# Patient Record
Sex: Female | Born: 2000 | Race: White | Hispanic: No | Marital: Single | Smoking: Never smoker
Health system: Southern US, Community
[De-identification: ages and names within clinical notes are randomized; demographics above are authoritative.]

## PROBLEM LIST (undated history)

## (undated) DIAGNOSIS — N946 Dysmenorrhea, unspecified: Secondary | ICD-10-CM

## (undated) HISTORY — DX: Dysmenorrhea, unspecified: N94.6

## (undated) HISTORY — PX: INTRAUTERINE DEVICE (IUD) INSERTION: SHX5877

---
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
## 2018-10-04 ENCOUNTER — Other Ambulatory Visit: Payer: Self-pay

## 2018-10-04 NOTE — Progress Notes (Signed)
18 y.o. Single Caucasian female G0P0000 here with complaint of vaginal symptoms of itching, burning, and increase discharge. Describes discharge as thicker, no odor, white in color. Partner no symptoms.  Onset of symptoms 6  days ago. Denies new personal products. Has been swimming and staying in swimsuit longer times. Sexually active, no partner change. No STD concerns or testing needed.  Urinary symptoms at onset of frequency, but has stopped. Contraception is IUD [place], working well.  Review of Systems  Constitutional: Negative.   HENT: Negative.   Eyes: Negative.   Respiratory: Negative.   Cardiovascular: Negative.   Gastrointestinal: Negative.   Genitourinary: Negative.   Musculoskeletal: Negative.   Skin:       Vaginal itching, irritation & discharge  Neurological: Negative.   Endo/Heme/Allergies: Negative.   Psychiatric/Behavioral: Negative.     O:Healthy female WDWN Affect: normal, orientation x 3  Exam: Healthy female WDWN Abdomen:Soft, non tender, negative suprapubic Lymph node: no enlargement or tenderness Pelvic exam: External genital: normal female BUS: negative Bladder, urethral meatus non tender Vagina: white thick slight odorous discharge noted.     Affirm taken Cervix: normal, non tender, no CMT, IUD string noted in cervix Uterus: normal, non tender Adnexa:normal, non tender, no masses or fullness noted    A:Normal pelvic exam Contraception Mirena IUD R/O vaginal infection   P:Discussed findings of normal pelvic exam with increase white slightly odorous discharge and etiology. Discussed Aveeno or baking soda sitz bath for comfort. Avoid moist clothes  for extended period of time. If working out in gym clothes or swim suits for long periods of time change underwear or bottoms of swimsuit if possible. STD prevention with condom use recommended. Questions addressed. Lab: Affirm  Rv prn

## 2018-10-05 ENCOUNTER — Other Ambulatory Visit: Payer: Self-pay

## 2018-10-05 ENCOUNTER — Ambulatory Visit: Payer: 59 | Admitting: Certified Nurse Midwife

## 2018-10-05 ENCOUNTER — Encounter: Payer: Self-pay | Admitting: Certified Nurse Midwife

## 2018-10-05 VITALS — BP 100/64 | HR 64 | Temp 97.2°F | Resp 16 | Wt 119.0 lb

## 2018-10-05 DIAGNOSIS — N898 Other specified noninflammatory disorders of vagina: Secondary | ICD-10-CM

## 2018-10-05 DIAGNOSIS — Z01419 Encounter for gynecological examination (general) (routine) without abnormal findings: Secondary | ICD-10-CM

## 2018-10-06 LAB — VAGINITIS/VAGINOSIS, DNA PROBE
Candida Species: NEGATIVE
Gardnerella vaginalis: NEGATIVE
Trichomonas vaginosis: NEGATIVE

## 2019-03-01 NOTE — Progress Notes (Signed)
18 y.o. G0P0000 Single  Caucasian Fe here for annual exam. Contraception Mirena IUD working well.  No partner change. Desires STD screening. Partner change.  Attending college and now out for long break. All classes online now. No other health issues.  No LMP recorded. (Menstrual status: IUD).          Sexually active: Yes.    The current method of family planning is condoms all the time & IUD.    Exercising: Yes.    occasional Smoker:  no  Review of Systems  Constitutional: Negative.   HENT: Negative.   Eyes: Negative.   Respiratory: Negative.   Cardiovascular: Negative.   Gastrointestinal: Negative.   Genitourinary: Negative.   Musculoskeletal: Negative.   Skin: Negative.   Neurological: Negative.   Endo/Heme/Allergies: Negative.   Psychiatric/Behavioral: Negative.     Health Maintenance: Pap:  none History of Abnormal Pap: no MMG:  none Self Breast exams: yes Colonoscopy:  none BMD:   none TDaP:  2012 Shingles: no Pneumonia: 2001 Hep C and HIV: not done Labs: yes   reports that she has never smoked. She has never used smokeless tobacco. She reports that she does not drink alcohol or use drugs.  Past Medical History:  Diagnosis Date  . Dysmenorrhea     Past Surgical History:  Procedure Laterality Date  . INTRAUTERINE DEVICE (IUD) INSERTION     06-15-17 mirena inserted    Current Outpatient Medications  Medication Sig Dispense Refill  . doxycycline (VIBRAMYCIN) 100 MG capsule     . levonorgestrel (MIRENA) 20 MCG/24HR IUD 1 each by Intrauterine route once.    . ondansetron (ZOFRAN-ODT) 4 MG disintegrating tablet Take 4 mg by mouth every 8 (eight) hours as needed. for nausea  3   No current facility-administered medications for this visit.     Family History  Problem Relation Age of Onset  . Cancer Maternal Grandfather   . Diabetes Maternal Grandfather     ROS:  Pertinent items are noted in HPI.  Otherwise, a comprehensive ROS was negative.  Exam:    There were no vitals taken for this visit.   Ht Readings from Last 3 Encounters:  12/18/17 5' 5.25" (1.657 m) (65 %, Z= 0.38)*  11/25/17 5\' 6"  (1.676 m) (75 %, Z= 0.68)*  01/08/17 5' 5.25" (1.657 m) (66 %, Z= 0.40)*   * Growth percentiles are based on CDC (Girls, 2-20 Years) data.    General appearance: alert, cooperative and appears stated age Head: Normocephalic, without obvious abnormality, atraumatic Neck: no adenopathy, supple, symmetrical, trachea midline and thyroid normal to inspection and palpation Lungs: clear to auscultation bilaterally Breasts: normal appearance, no masses or tenderness, No nipple retraction or dimpling, No nipple discharge or bleeding, No axillary or supraclavicular adenopathy Heart: regular rate and rhythm Abdomen: soft, non-tender; no masses,  no organomegaly Extremities: extremities normal, atraumatic, no cyanosis or edema Skin: Skin color, texture, turgor normal. No rashes or lesions Lymph nodes: Cervical, supraclavicular, and axillary nodes normal. No abnormal inguinal nodes palpated Neurologic: Grossly normal   Pelvic: External genitalia:  no lesions              Urethra:  normal appearing urethra with no masses, tenderness or lesions              Bartholin's and Skene's: normal                 Vagina: normal appearing vagina with normal color and discharge, no lesions  Cervix: no cervical motion tenderness, no lesions, nulliparous appearance and IUD string noted in cervix              Pap taken: No. Bimanual Exam:  Uterus:  normal size, contour, position, consistency, mobility, non-tender and anteverted              Adnexa: normal adnexa and no mass, fullness, tenderness               Rectovaginal: Confirms               Anus:  normal appearance, no lesions  Chaperone present: yes [place]  A:  Well Woman with normal exam  Contraception Mirena IUD  STD screening  P:   Reviewed health and wellness pertinent to exam  Warning  signs with IUD reviewed and importance of notifying if occurs. Aware of bleeding profile expectations  Lab: HIV, RPR, Hep C, Affirm, GC,Chlamydia  Pap smear: age 18   counseled on breast self exam, STD prevention, HIV risk factors and prevention, feminine hygiene, adequate intake of calcium and vitamin D, diet and exercise  return annually or prn  An After Visit Summary was printed and given to the patient.

## 2019-03-02 ENCOUNTER — Other Ambulatory Visit: Payer: Self-pay

## 2019-03-02 ENCOUNTER — Ambulatory Visit: Payer: 59 | Admitting: Certified Nurse Midwife

## 2019-03-02 ENCOUNTER — Encounter: Payer: Self-pay | Admitting: Certified Nurse Midwife

## 2019-03-02 VITALS — BP 118/80 | HR 72 | Temp 97.0°F | Resp 16 | Ht 65.0 in | Wt 115.0 lb

## 2019-03-02 DIAGNOSIS — Z113 Encounter for screening for infections with a predominantly sexual mode of transmission: Secondary | ICD-10-CM

## 2019-03-02 DIAGNOSIS — Z01419 Encounter for gynecological examination (general) (routine) without abnormal findings: Secondary | ICD-10-CM | POA: Diagnosis not present

## 2019-03-02 NOTE — Patient Instructions (Signed)
General topics  Next pap or exam is  due in 1 year Take a Women's multivitamin Take 1200 mg. of calcium daily - prefer dietary If any concerns in interim to call back  Breast Self-Awareness Practicing breast self-awareness may pick up problems early, prevent significant medical complications, and possibly save your life. By practicing breast self-awareness, you can become familiar with how your breasts look and feel and if your breasts are changing. This allows you to notice changes early. It can also offer you some reassurance that your breast health is good. One way to learn what is normal for your breasts and whether your breasts are changing is to do a breast self-exam. If you find a lump or something that was not present in the past, it is best to contact your caregiver right away. Other findings that should be evaluated by your caregiver include nipple discharge, especially if it is bloody; skin changes or reddening; areas where the skin seems to be pulled in (retracted); or new lumps and bumps. Breast pain is seldom associated with cancer (malignancy), but should also be evaluated by a caregiver. BREAST SELF-EXAM The best time to examine your breasts is 5 7 days after your menstrual period is over.  ExitCare Patient Information 2013 [place].   Exercise to Stay Healthy Exercise helps you become and stay healthy. EXERCISE IDEAS AND TIPS Choose exercises that:  You enjoy.  Fit into your day. You do not need to exercise really hard to be healthy. You can do exercises at a slow or medium level and stay healthy. You can:  Stretch before and after working out.  Try yoga, Pilates, or tai chi.  Lift weights.  Walk fast, swim, jog, run, climb stairs, bicycle, dance, or rollerskate.  Take aerobic classes. Exercises that burn about 150 calories:  Running 1  miles in 15 minutes.  Playing volleyball for 45 to 60 minutes.  Washing and waxing a car for 45 to 60 minutes.   Playing touch football for 45 minutes.  Walking 1  miles in 35 minutes.  Pushing a stroller 1  miles in 30 minutes.  Playing basketball for 30 minutes.  Raking leaves for 30 minutes.  Bicycling 5 miles in 30 minutes.  Walking 2 miles in 30 minutes.  Dancing for 30 minutes.  Shoveling snow for 15 minutes.  Swimming laps for 20 minutes.  Walking up stairs for 15 minutes.  Bicycling 4 miles in 15 minutes.  Gardening for 30 to 45 minutes.  Jumping rope for 15 minutes.  Washing windows or floors for 45 to 60 minutes. Document Released: 04/12/2010 Document Revised: 06/02/2011 Document Reviewed: 04/12/2010 [facility] Patient Information 2013 [place].   Other topics ( that may be useful information):    Sexually Transmitted Disease Sexually transmitted disease (STD) refers to any infection that is passed from person to person during sexual activity. This may happen by way of saliva, semen, blood, vaginal mucus, or urine. Common STDs include:  Gonorrhea.  Chlamydia.  Syphilis.  HIV/AIDS.  Genital herpes.  Hepatitis B and C.  Trichomonas.  Human papillomavirus (HPV).  Pubic lice. CAUSES  An STD may be spread by bacteria, virus, or parasite. A person can get an STD by:  Sexual intercourse with an infected person.  Sharing sex toys with an infected person.  Sharing needles with an infected person.  Having intimate contact with the genitals, mouth, or rectal areas of an infected person. SYMPTOMS  Some people may not have any symptoms, but  they can still pass the infection to others. Different STDs have different symptoms. Symptoms include:  Painful or bloody urination.  Pain in the pelvis, abdomen, vagina, anus, throat, or eyes.  Skin rash, itching, irritation, growths, or sores (lesions). These usually occur in the genital or anal area.  Abnormal vaginal discharge.  Penile discharge in men.  Soft, flesh-colored skin growths in the genital  or anal area.  Fever.  Pain or bleeding during sexual intercourse.  Swollen glands in the groin area.  Yellow skin and eyes (jaundice). This is seen with hepatitis. DIAGNOSIS  To make a diagnosis, your caregiver may:  Take a medical history.  Perform a physical exam.  Take a specimen (culture) to be examined.  Examine a sample of discharge under a microscope.  Perform blood test TREATMENT   Chlamydia, gonorrhea, trichomonas, and syphilis can be cured with antibiotic medicine.  Genital herpes, hepatitis, and HIV can be treated, but not cured, with prescribed medicines. The medicines will lessen the symptoms.  Genital warts from HPV can be treated with medicine or by freezing, burning (electrocautery), or surgery. Warts may come back.  HPV is a virus and cannot be cured with medicine or surgery.However, abnormal areas may be followed very closely by your caregiver and may be removed from the cervix, vagina, or vulva through office procedures or surgery. If your diagnosis is confirmed, your recent sexual partners need treatment. This is true even if they are symptom-free or have a negative culture or evaluation. They should not have sex until their caregiver says it is okay. HOME CARE INSTRUCTIONS  All sexual partners should be informed, tested, and treated for all STDs.  Take your antibiotics as directed. Finish them even if you start to feel better.  Only take over-the-counter or prescription medicines for pain, discomfort, or fever as directed by your caregiver.  Rest.  Eat a balanced diet and drink enough fluids to keep your urine clear or pale yellow.  Do not have sex until treatment is completed and you have followed up with your caregiver. STDs should be checked after treatment.  Keep all follow-up appointments, Pap tests, and blood tests as directed by your caregiver.  Only use latex condoms and water-soluble lubricants during sexual activity. Do not use petroleum  jelly or oils.  Avoid alcohol and illegal drugs.  Get vaccinated for HPV and hepatitis. If you have not received these vaccines in the past, talk to your caregiver about whether one or both might be right for you.  Avoid risky sex practices that can break the skin. The only way to avoid getting an STD is to avoid all sexual activity.Latex condoms and dental dams (for oral sex) will help lessen the risk of getting an STD, but will not completely eliminate the risk. SEEK MEDICAL CARE IF:   You have a fever.  You have any new or worsening symptoms. Document Released: 05/31/2002 Document Revised: 06/02/2011 Document Reviewed: 06/07/2010 [facility] Patient Information 2013 [place].    Domestic Abuse You are being battered or abused if someone close to you hits, pushes, or physically hurts you in any way. You also are being abused if you are forced into activities. You are being sexually abused if you are forced to have sexual contact of any kind. You are being emotionally abused if you are made to feel worthless or if you are constantly threatened. It is important to remember that help is available. No one has the right to abuse you. PREVENTION OF FURTHER  ABUSE  Learn the warning signs of danger. This varies with situations but may include: the use of alcohol, threats, isolation from friends and family, or forced sexual contact. Leave if you feel that violence is going to occur.  If you are attacked or beaten, report it to the police so the abuse is documented. You do not have to press charges. The police can protect you while you or the attackers are leaving. Get the officer's name and badge number and a copy of the report.  Find someone you can trust and tell them what is happening to you: your caregiver, a nurse, clergy member, close friend or family member. Feeling ashamed is natural, but remember that you have done nothing wrong. No one deserves abuse. Document Released: 03/07/2000  Document Revised: 06/02/2011 Document Reviewed: 05/16/2010 [facility] Patient Information 2013 [place].    How Much is Too Much Alcohol? Drinking too much alcohol can cause injury, accidents, and health problems. These types of problems can include:   Car crashes.  Falls.  Family fighting (domestic violence).  Drowning.  Fights.  Injuries.  Burns.  Damage to certain organs.  Having a baby with birth defects. ONE DRINK CAN BE TOO MUCH WHEN YOU ARE:  Working.  Pregnant or breastfeeding.  Taking medicines. Ask your doctor.  Driving or planning to drive. If you or someone you know has a drinking problem, get help from a doctor.  Document Released: 01/04/2009 Document Revised: 06/02/2011 Document Reviewed: 01/04/2009 [facility] Patient Information 2013 [place].   Smoking Hazards Smoking cigarettes is extremely bad for your health. Tobacco smoke has over 200 known poisons in it. There are over 60 chemicals in tobacco smoke that cause cancer. Some of the chemicals found in cigarette smoke include:   Cyanide.  Benzene.  Formaldehyde.  Methanol (wood alcohol).  Acetylene (fuel used in welding torches).  Ammonia. Cigarette smoke also contains the poisonous gases nitrogen oxide and carbon monoxide.  Cigarette smokers have an increased risk of many serious medical problems and Smoking causes approximately:  90% of all lung cancer deaths in men.  80% of all lung cancer deaths in women.  90% of deaths from chronic obstructive lung disease. Compared with nonsmokers, smoking increases the risk of:  Coronary heart disease by 2 to 4 times.  Stroke by 2 to 4 times.  Men developing lung cancer by 23 times.  Women developing lung cancer by 13 times.  Dying from chronic obstructive lung diseases by 12 times.  . Smoking is the most preventable cause of death and disease in our society.  WHY IS SMOKING ADDICTIVE?  Nicotine is the chemical agent in  tobacco that is capable of causing addiction or dependence.  When you smoke and inhale, nicotine is absorbed rapidly into the bloodstream through your lungs. Nicotine absorbed through the lungs is capable of creating a powerful addiction. Both inhaled and non-inhaled nicotine may be addictive.  Addiction studies of cigarettes and spit tobacco show that addiction to nicotine occurs mainly during the teen years, when young people begin using tobacco products. WHAT ARE THE BENEFITS OF QUITTING?  There are many health benefits to quitting smoking.   Likelihood of developing cancer and heart disease decreases. Health improvements are seen almost immediately.  Blood pressure, pulse rate, and breathing patterns start returning to normal soon after quitting. QUITTING SMOKING   American Lung Association - 1-800-LUNGUSA  American Cancer Society - 1-800-ACS-2345 Document Released: 04/17/2004 Document Revised: 06/02/2011 Document Reviewed: 12/20/2008 [facility] Patient Information 2013 [place].   Stress Management Stress is a state of physical or mental tension that often results from changes in your life or normal routine. Some common causes of stress are:  Death of a loved one.  Injuries or severe illnesses.  Getting fired or changing jobs.  Moving into a new home. Other causes may be:  Sexual problems.  Business or financial losses.  Taking on a large debt.  Regular conflict with someone at home or at work.  Constant tiredness from lack of sleep. It is not just bad things that are stressful. It may be stressful to:  Win the lottery.  Get married.  Buy a new car. The amount of stress that can be easily tolerated varies from person to person. Changes generally cause stress, regardless of the types of change. Too much stress can affect your health. It may lead to physical or emotional problems. Too little stress (boredom) may also become stressful. SUGGESTIONS TO REDUCE  STRESS:  Talk things over with your family and friends. It often is helpful to share your concerns and worries. If you feel your problem is serious, you may want to get help from a professional counselor.  Consider your problems one at a time instead of lumping them all together. Trying to take care of everything at once may seem impossible. List all the things you need to do and then start with the most important one. Set a goal to accomplish 2 or 3 things each day. If you expect to do too many in a single day you will naturally fail, causing you to feel even more stressed.  Do not use alcohol or drugs to relieve stress. Although you may feel better for a short time, they do not remove the problems that caused the stress. They can also be habit forming.  Exercise regularly - at least 3 times per week. Physical exercise can help to relieve that "uptight" feeling and will relax you.  The shortest distance between despair and hope is often a good night's sleep.  Go to bed and get up on time allowing yourself time for appointments without being rushed.  Take a short "time-out" period from any stressful situation that occurs during the day. Close your eyes and take some deep breaths. Starting with the muscles in your face, tense them, hold it for a few seconds, then relax. Repeat this with the muscles in your neck, shoulders, hand, stomach, back and legs.  Take good care of yourself. Eat a balanced diet and get plenty of rest.  Schedule time for having fun. Take a break from your daily routine to relax. HOME CARE INSTRUCTIONS   Call if you feel overwhelmed by your problems and feel you can no longer manage them on your own.  Return immediately if you feel like hurting yourself or someone else. Document Released: 09/03/2000 Document Revised: 06/02/2011 Document Reviewed: 04/26/2007 [facility] Patient Information 2013 [place].

## 2019-03-03 LAB — VAGINITIS/VAGINOSIS, DNA PROBE
Candida Species: NEGATIVE
Gardnerella vaginalis: NEGATIVE
Trichomonas vaginosis: NEGATIVE

## 2019-03-03 LAB — RPR: RPR Ser Ql: NONREACTIVE

## 2019-03-03 LAB — HIV ANTIBODY (ROUTINE TESTING W REFLEX): HIV Screen 4th Generation wRfx: NONREACTIVE

## 2019-03-03 LAB — HEPATITIS C ANTIBODY: Hep C Virus Ab: <0.1 s/co ratio (ref 0.0–0.9)

## 2019-03-04 LAB — GC/CHLAMYDIA PROBE AMP
Chlamydia trachomatis, NAA: NEGATIVE
Neisseria Gonorrhoeae by PCR: NEGATIVE

## 2019-06-06 ENCOUNTER — Encounter: Payer: Self-pay | Admitting: Certified Nurse Midwife

## 2019-06-08 ENCOUNTER — Encounter: Payer: Self-pay | Admitting: Certified Nurse Midwife

## 2020-03-06 DIAGNOSIS — G8929 Other chronic pain: Secondary | ICD-10-CM | POA: Insufficient documentation

## 2020-03-06 DIAGNOSIS — F419 Anxiety disorder, unspecified: Secondary | ICD-10-CM | POA: Insufficient documentation

## 2020-09-11 NOTE — Progress Notes (Signed)
20 y.o. G0P0000 Single White or Caucasian Not Hispanic or Latino female here for annual exam.  She has a mirena IUD, placed in 3/19. No bleeding. Sexually active, no current partner. Using condoms. No dyspareunia.    No LMP recorded. (Menstrual status: IUD).          Sexually active: Yes.    The current method of family planning is IUD.    Exercising: Yes.     Walking  Smoker:  no  Health Maintenance: Pap:  03/06/20 normal with primary.  History of abnormal Pap:  NA MMG:  NA BMD:   NA Colonoscopy: NA TDaP:  10/18/10 Gardasil: Complete   reports that she has never smoked. She has never used smokeless tobacco. She reports that she does not drink alcohol and does not use drugs. 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 Wants to go to law school.   Past Medical History:  Diagnosis Date   Dysmenorrhea     Past Surgical History:  Procedure Laterality Date   INTRAUTERINE DEVICE (IUD) INSERTION     06-15-17 mirena inserted    Current Outpatient Medications  Medication Sig Dispense Refill   doxycycline (VIBRAMYCIN) 100 MG capsule      levonorgestrel (MIRENA) 20 MCG/24HR IUD 1 each by Intrauterine route once.     meloxicam (MOBIC) 7.5 MG tablet TAKE 1-2 TABLETS OF MELOXICAM 7.5 MG BY MOUTH DAILY AS NEEDED FOR PAIN.     No current facility-administered medications for this visit.    Family History  Problem Relation Age of Onset   Cancer Maternal Grandfather    Diabetes Maternal Grandfather     Review of Systems  All other systems reviewed and are negative.  Exam:   BP 118/72   Pulse (!) 109   Ht 5\' 5"  (1.651 m)   Wt 135 lb 6.4 oz (61.4 kg)   SpO2 100%   BMI 22.53 kg/m   Weight change: @WEIGHTCHANGE @ Height:   Height: 5\' 5"  (165.1 cm)  Ht Readings from Last 3 Encounters:  09/17/20 5\' 5"  (1.651 m)  03/02/19 5\' 5"  (1.651 m)  12/18/17 5' 5.25" (1.657 m) (65 %, Z= 0.38)*   * Growth percentiles are based on CDC (Girls, 2-20  Years) data.    General appearance: alert, cooperative and appears stated age Head: Normocephalic, without obvious abnormality, atraumatic Neck: no adenopathy, supple, symmetrical, trachea midline and thyroid normal to inspection and palpation Lungs: clear to auscultation bilaterally Cardiovascular: regular rate and rhythm Breasts: normal appearance, no masses or tenderness Abdomen: soft, non-tender; non distended,  no masses,  no organomegaly Extremities: extremities normal, atraumatic, no cyanosis or edema Skin: Skin color, texture, turgor normal. No rashes or lesions Lymph nodes: Cervical, supraclavicular, and axillary nodes normal. No abnormal inguinal nodes palpated Neurologic: Grossly normal   Pelvic: External genitalia:  no lesions              Urethra:  normal appearing urethra with no masses, tenderness or lesions              Bartholins and Skenes: normal                 Vagina: normal appearing vagina with normal color and discharge, no lesions              Cervix: no lesions and IUD string 3 cm               Bimanual Exam:  Uterus:  normal size, contour, position, consistency, mobility, non-tender and anteverted              Adnexa: no mass, fullness, tenderness               Rectovaginal: Confirms               Anus:  normal sphincter tone, no lesions  Elane Fritz, CMA chaperoned for the exam.  1. Well woman exam Discussed breast self exam Discussed calcium and vit D intake Labs with primary Pap UTD  2. Screening examination for STD (sexually transmitted disease) - HIV Antibody (routine testing w rflx) - RPR - SURESWAB CT/NG/T. vaginalis  3. IUD check up Doing well  4. Immunization due - Tdap vaccine greater than or equal to 7yo IM

## 2020-09-17 ENCOUNTER — Other Ambulatory Visit: Payer: Self-pay

## 2020-09-17 ENCOUNTER — Ambulatory Visit (INDEPENDENT_AMBULATORY_CARE_PROVIDER_SITE_OTHER): Payer: 59 | Admitting: Obstetrics and Gynecology

## 2020-09-17 ENCOUNTER — Encounter: Payer: Self-pay | Admitting: Obstetrics and Gynecology

## 2020-09-17 VITALS — BP 118/72 | HR 109 | Ht 65.0 in | Wt 135.4 lb

## 2020-09-17 DIAGNOSIS — Z01419 Encounter for gynecological examination (general) (routine) without abnormal findings: Secondary | ICD-10-CM

## 2020-09-17 DIAGNOSIS — Z30431 Encounter for routine checking of intrauterine contraceptive device: Secondary | ICD-10-CM

## 2020-09-17 DIAGNOSIS — Z23 Encounter for immunization: Secondary | ICD-10-CM

## 2020-09-17 DIAGNOSIS — Z113 Encounter for screening for infections with a predominantly sexual mode of transmission: Secondary | ICD-10-CM

## 2020-09-17 NOTE — Patient Instructions (Signed)

## 2020-09-18 LAB — HIV ANTIBODY (ROUTINE TESTING W REFLEX): HIV 1&2 Ab, 4th Generation: NONREACTIVE

## 2020-09-18 LAB — SURESWAB CT/NG/T. VAGINALIS
C. trachomatis RNA, TMA: NOT DETECTED
N. gonorrhoeae RNA, TMA: NOT DETECTED
Trichomonas vaginalis RNA: NOT DETECTED

## 2020-09-18 LAB — RPR: RPR Ser Ql: NONREACTIVE

## 2021-04-29 ENCOUNTER — Other Ambulatory Visit: Payer: Self-pay

## 2021-04-29 ENCOUNTER — Encounter: Payer: Self-pay | Admitting: Obstetrics and Gynecology

## 2021-04-29 ENCOUNTER — Ambulatory Visit: Payer: 59 | Admitting: Obstetrics and Gynecology

## 2021-04-29 VITALS — BP 110/80 | HR 76 | Ht 65.0 in | Wt 134.0 lb

## 2021-04-29 DIAGNOSIS — N760 Acute vaginitis: Secondary | ICD-10-CM

## 2021-04-29 DIAGNOSIS — B3731 Acute candidiasis of vulva and vagina: Secondary | ICD-10-CM | POA: Diagnosis not present

## 2021-04-29 LAB — WET PREP FOR TRICH, YEAST, CLUE

## 2021-04-29 MED ORDER — FLUCONAZOLE 150 MG PO TABS: 150.0000 mg | ORAL_TABLET | Freq: Once | ORAL | 0 refills | Status: AC

## 2021-04-29 MED ORDER — BETAMETHASONE VALERATE 0.1 % EX OINT: 1.0000 "application " | TOPICAL_OINTMENT | Freq: Two times a day (BID) | CUTANEOUS | 0 refills | Status: AC

## 2021-04-29 NOTE — Patient Instructions (Signed)
Vaginal Yeast Infection, Adult °Vaginal yeast infection is a condition that causes vaginal discharge as well as soreness, swelling, and redness (inflammation) of the vagina. This is a common condition. Some women get this infection frequently. °What are the causes? °This condition is caused by a change in the normal balance of the yeast (Candida) and normal bacteria that live in the vagina. This change causes an overgrowth of yeast, which causes the inflammation. °What increases the risk? °The condition is more likely to develop in women who: °Take antibiotic medicines. °Have diabetes. °Take birth control pills. °Are pregnant. °Douche often. °Have a weak body defense system (immune system). °Have been taking steroid medicines for a long time. °Frequently wear tight clothing. °What are the signs or symptoms? °Symptoms of this condition include: °White, thick, creamy vaginal discharge. °Swelling, itching, redness, and irritation of the vagina. The lips of the vagina (labia) may be affected as well. °Pain or a burning feeling while urinating. °Pain during sex. °How is this diagnosed? °This condition is diagnosed based on: °Your medical history. °A physical exam. °A pelvic exam. Your health care provider will examine a sample of your vaginal discharge under a microscope. Your health care provider may send this sample for testing to confirm the diagnosis. °How is this treated? °This condition is treated with medicine. Medicines may be over-the-counter or prescription. You may be told to use one or more of the following: °Medicine that is taken by mouth (orally). °Medicine that is applied as a cream (topically). °Medicine that is inserted directly into the vagina (suppository). °Follow these instructions at home: °Take or apply over-the-counter and prescription medicines only as told by your health care provider. °Do not use tampons until your health care provider approves. °Do not have sex until your infection has  cleared. Sex can prolong or worsen your symptoms of infection. Ask your health care provider when it is safe to resume sexual activity. °Keep all follow-up visits. This is important. °How is this prevented? ° °Do not wear tight clothes, such as pantyhose or tight pants. °Wear breathable cotton underwear. °Do not use douches, perfumed soap, creams, or powders. °Wipe from front to back after using the toilet. °If you have diabetes, keep your blood sugar levels under control. °Ask your health care provider for other ways to prevent yeast infections. °Contact a health care provider if: °You have a fever. °Your symptoms go away and then return. °Your symptoms do not get better with treatment. °Your symptoms get worse. °You have new symptoms. °You develop blisters in or around your vagina. °You have blood coming from your vagina and it is not your menstrual period. °You develop pain in your abdomen. °Summary °Vaginal yeast infection is a condition that causes discharge as well as soreness, swelling, and redness (inflammation) of the vagina. °This condition is treated with medicine. Medicines may be over-the-counter or prescription. °Take or apply over-the-counter and prescription medicines only as told by your health care provider. °Do not douche. Resume sexual activity or use of tampons as instructed by your health care provider. °Contact a health care provider if your symptoms do not get better with treatment or your symptoms go away and then return. °This information is not intended to replace advice given to you by your health care provider. Make sure you discuss any questions you have with your health care provider. °Document Revised: 05/28/2020 Document Reviewed: 05/28/2020 °Elsevier Patient Education © 2022 Elsevier Inc. ° °

## 2021-04-29 NOTE — Progress Notes (Signed)
GYNECOLOGY  VISIT   HPI: 21 y.o.   Single White or Caucasian Not Hispanic or Latino  female   G0P0000 with No LMP recorded. (Menstrual status: IUD).   here for vaginal itching and discharge after taking an antibiotic for 10 days.  She has a 4 day h/o a thick clumpy, white vaginal d/c and severe itching.   GYNECOLOGIC HISTORY: No LMP recorded. (Menstrual status: IUD). Contraception:IUD  Menopausal hormone therapy: none         OB History     Gravida  0   Para  0   Term  0   Preterm  0   AB  0   Living  0      SAB  0   IAB  0   Ectopic  0   Multiple  0   Live Births  0              Patient Active Problem List   Diagnosis Date Noted   Anxiety 03/06/2020   Chronic low back pain without sciatica 03/06/2020    Past Medical History:  Diagnosis Date   Dysmenorrhea     Past Surgical History:  Procedure Laterality Date   INTRAUTERINE DEVICE (IUD) INSERTION     06-15-17 mirena inserted    Current Outpatient Medications  Medication Sig Dispense Refill   levonorgestrel (MIRENA) 20 MCG/24HR IUD 1 each by Intrauterine route once.     meloxicam (MOBIC) 7.5 MG tablet TAKE 1-2 TABLETS OF MELOXICAM 7.5 MG BY MOUTH DAILY AS NEEDED FOR PAIN.     No current facility-administered medications for this visit.     ALLERGIES: Patient has no known allergies.  Family History  Problem Relation Age of Onset   Cancer Maternal Grandfather    Diabetes Maternal Grandfather     Social History   Socioeconomic History   Marital status: Single    Spouse name: Not on file   Number of children: Not on file   Years of education: Not on file   Highest education level: Not on file  Occupational History   Not on file  Tobacco Use   Smoking status: Never   Smokeless tobacco: Never  Substance and Sexual Activity   Alcohol use: No   Drug use: No   Sexual activity: Yes    Partners: Male    Birth control/protection: Condom, I.U.D.  Other Topics Concern   Not on file   Social History Narrative   Not on file   Social Determinants of Health   Financial Resource Strain: Not on file  Food Insecurity: Not on file  Transportation Needs: Not on file  Physical Activity: Not on file  Stress: Not on file  Social Connections: Not on file  Intimate Partner Violence: Not on file    Review of Systems  All other systems reviewed and are negative.  PHYSICAL EXAMINATION:    BP 110/80    Pulse 76    Ht 5\' 5"  (1.651 m)    Wt 134 lb (60.8 kg)    SpO2 100%    BMI 22.30 kg/m     General appearance: alert, cooperative and appears stated age  Pelvic: External genitalia:  no lesions              Urethra:  normal appearing urethra with no masses, tenderness or lesions              Bartholins and Skenes: normal  Vagina: normal appearing vagina with normal color and discharge, no lesions              Cervix: no lesions and IUD string 2-3 cm               Chaperone was present for exam.  1. Acute vaginitis - WET PREP FOR TRICH, YEAST, CLUE - betamethasone valerate ointment (VALISONE) 0.1 %; Apply 1 application topically 2 (two) times daily.  Dispense: 30 g; Refill: 0  2. Yeast vaginitis - fluconazole (DIFLUCAN) 150 MG tablet; Take 1 tablet (150 mg total) by mouth once for 1 dose. Take one tablet.  Repeat in 72 hours if symptoms are not completely resolved.  Dispense: 2 tablet; Refill: 0

## 2021-10-24 ENCOUNTER — Encounter: Payer: Self-pay | Admitting: Radiology

## 2021-10-24 ENCOUNTER — Ambulatory Visit: Payer: 59 | Admitting: Obstetrics and Gynecology

## 2021-10-24 ENCOUNTER — Ambulatory Visit (INDEPENDENT_AMBULATORY_CARE_PROVIDER_SITE_OTHER): Payer: 59 | Admitting: Radiology

## 2021-10-24 VITALS — BP 102/70 | Ht 65.0 in | Wt 144.0 lb

## 2021-10-24 DIAGNOSIS — Z30431 Encounter for routine checking of intrauterine contraceptive device: Secondary | ICD-10-CM

## 2021-10-24 DIAGNOSIS — N760 Acute vaginitis: Secondary | ICD-10-CM | POA: Diagnosis not present

## 2021-10-24 DIAGNOSIS — Z01419 Encounter for gynecological examination (general) (routine) without abnormal findings: Secondary | ICD-10-CM | POA: Diagnosis not present

## 2021-10-24 DIAGNOSIS — B3731 Acute candidiasis of vulva and vagina: Secondary | ICD-10-CM | POA: Diagnosis not present

## 2021-10-24 DIAGNOSIS — B9689 Other specified bacterial agents as the cause of diseases classified elsewhere: Secondary | ICD-10-CM

## 2021-10-24 DIAGNOSIS — Z113 Encounter for screening for infections with a predominantly sexual mode of transmission: Secondary | ICD-10-CM | POA: Diagnosis not present

## 2021-10-24 LAB — WET PREP FOR TRICH, YEAST, CLUE

## 2021-10-24 MED ORDER — METRONIDAZOLE 500 MG PO TABS
500.0000 mg | ORAL_TABLET | Freq: Two times a day (BID) | ORAL | 0 refills | Status: AC
Start: 2021-10-24 — End: ?

## 2021-10-24 MED ORDER — FLUCONAZOLE 150 MG PO TABS: 150.0000 mg | ORAL_TABLET | ORAL | 0 refills | Status: AC

## 2021-10-24 NOTE — Progress Notes (Signed)
Connie Phillips Dec 09, 2000 [id number]   History:  21 y.o. G0 presents for annual exam. Moving to [place] next [place], moving in with her sister. Complains of vaginal discharge, itching, odor (noticed over the weekend--sx's better today) Began after going to the beach for a week. Pap 03/2020 @ PCP  Gynecologic History No LMP recorded. (Menstrual status: IUD).   Contraception/Family planning: IUD Sexually active: yes Last Pap: 1/22. Results were: normal Last mammogram: n/a.   Obstetric History OB History  Gravida Para Term Preterm AB Living  0 0 0 0 0 0  SAB IAB Ectopic Multiple Live Births  0 0 0 0 0     The following portions of the patient's history were reviewed and updated as appropriate: allergies, current medications, past family history, past medical history, past social history, past surgical history, and problem list.  Review of Systems Pertinent items noted in HPI and remainder of comprehensive ROS otherwise negative.   Past medical history, past surgical history, family history and social history were all reviewed and documented in the EPIC chart.   Exam:  Vitals:   10/24/21 1148  BP: 102/70  Weight: 144 lb (65.3 kg)  Height: 5\' 5"  (1.651 m)   Body mass index is 23.96 kg/m.  General appearance:  Normal Thyroid:  Symmetrical, normal in size, without palpable masses or nodularity. Respiratory  Auscultation:  Clear without wheezing or rhonchi Cardiovascular  Auscultation:  Regular rate, without rubs, murmurs or gallops  Edema/varicosities:  Not grossly evident Abdominal  Soft,nontender, without masses, guarding or rebound.  Liver/spleen:  No organomegaly noted  Hernia:  None appreciated  Skin  Inspection:  Grossly normal Breasts: Examined lying and sitting.   Right: Without masses, retractions, nipple discharge or axillary adenopathy.   Left: Without masses, retractions, nipple discharge or axillary adenopathy. Genitourinary   Inguinal/mons:  Normal  without inguinal adenopathy  External genitalia:  Normal appearing vulva with no masses, tenderness, or lesions  BUS/Urethra/Skene's glands:  Normal without masses or exudate  Vagina:  Normal appearing with normal color and discharge, no lesions  Cervix:  Normal appearing without discharge or lesions. IUD strings seen ~2cm from os  Uterus:  Normal in size, shape and contour.  Mobile, nontender  Adnexa/parametria:     Rt: Normal in size, without masses or tenderness.   Lt: Normal in size, without masses or tenderness.  Anus and perineum: Normal   Patient informed chaperone available to be present for breast and pelvic exam. Patient has requested no chaperone to be present. Patient has been advised what will be completed during breast and pelvic exam.   Assessment/Plan:   1. Well woman exam with routine gynecological exam Pap due 2025  2. BV (bacterial vaginosis)  - metroNIDAZOLE (FLAGYL) 500 MG tablet; Take 1 tablet (500 mg total) by mouth 2 (two) times daily.  Dispense: 14 tablet; Refill: 0  3. Yeast vaginitis  - fluconazole (DIFLUCAN) 150 MG tablet; Take 1 tablet (150 mg total) by mouth every 3 (three) days.  Dispense: 3 tablet; Refill: 0  4. Screening examination for venereal disease  - SURESWAB CT/NG/T. vaginalis  5. Acute vaginitis  - WET PREP FOR TRICH, YEAST, CLUE  6. IUD check up Reassured strings seen     Discussed SBE, pap and STI screening as directed/appropriate. Recommend 2026 of exercise weekly, including weight bearing exercise. Encouraged the use of seatbelts and sunscreen. Return in 1 year for annual or as needed.   B WHNP-BC 12:21 PM 10/24/2021

## 2021-10-25 LAB — SURESWAB CT/NG/T. VAGINALIS
C. trachomatis RNA, TMA: NOT DETECTED
N. gonorrhoeae RNA, TMA: NOT DETECTED
Trichomonas vaginalis RNA: NOT DETECTED
